# Patient Record
Sex: Male | Born: 1965 | Hispanic: No
Health system: Southern US, Community
[De-identification: ages and names within clinical notes are randomized; demographics above are authoritative.]

---

## 2007-01-18 ENCOUNTER — Emergency Department: Payer: Self-pay | Admitting: Emergency Medicine
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
## 2016-06-17 ENCOUNTER — Telehealth: Payer: Self-pay | Admitting: Family Medicine

## 2016-06-17 NOTE — Telephone Encounter (Signed)
error
